# Patient Record
Sex: Male | Born: 1985 | Race: White | Hispanic: No | Marital: Single | Smoking: Never smoker
Health system: Southern US, Community
[De-identification: ages and names within clinical notes are randomized; demographics above are authoritative.]

---
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
## 2007-11-15 ENCOUNTER — Emergency Department (HOSPITAL_COMMUNITY): Admission: EM | Admit: 2007-11-15 | Discharge: 2007-11-15 | Payer: Self-pay | Admitting: Emergency Medicine

## 2007-11-17 ENCOUNTER — Inpatient Hospital Stay (HOSPITAL_COMMUNITY): Admission: EM | Admit: 2007-11-17 | Discharge: 2007-11-20 | Payer: Self-pay | Admitting: Emergency Medicine

## 2008-05-30 ENCOUNTER — Emergency Department (HOSPITAL_COMMUNITY): Admission: EM | Admit: 2008-05-30 | Discharge: 2008-05-30 | Payer: Self-pay | Admitting: Emergency Medicine

## 2009-10-29 IMAGING — US US ART/VEN ABD/PELV/SCROTUM DOPPLER COMPLETE
1 series · 14 of 25 positions shown · non-contrast
Comparison: None available

CLINICAL DATA: Left scrotal pain.

ULTRASOUND OF SCROTUM,ARTERIAL AND VENOUS ULTRASOUND OF THE ABDOMEN
PELVIS AND SCROTUM
TECHNIQUE: Complete ultrasound examination of the testicles,
epididymis, and other scrotal structures was performed.,
TECHNIQUE: Color and duplex Doppler ultrasound was utilized to
evaluate blood flow to the testicles and scrotal contents.

[Series 1: unknown · 0.09mm/px · 14 of 37 slices shown]
[im 1/37]
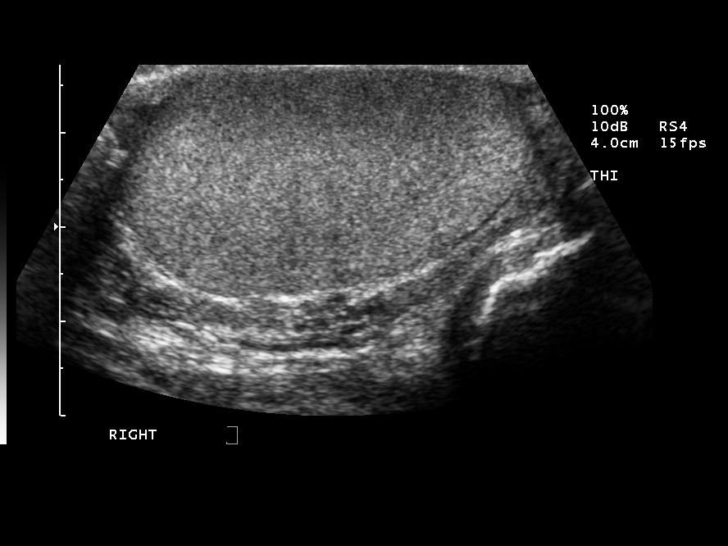
[im 4/37]
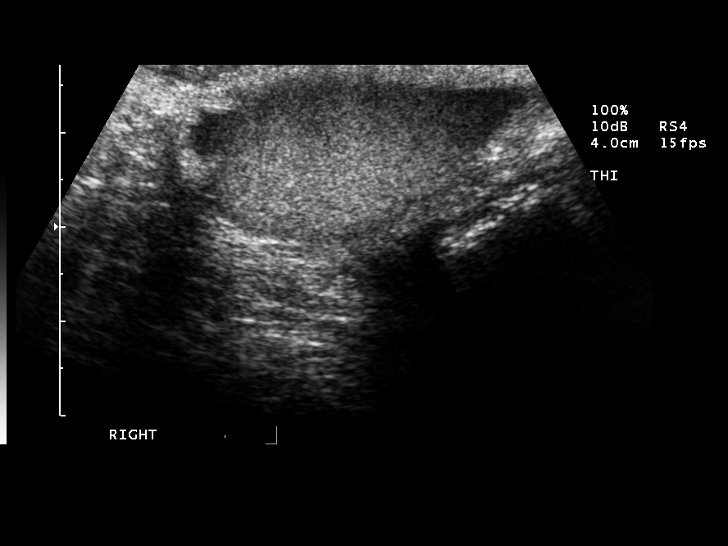
[im 7/37]
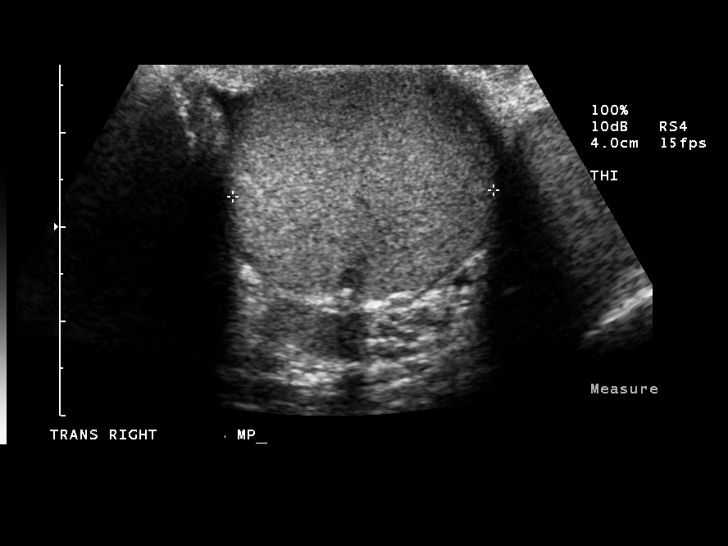
[im 10/37]
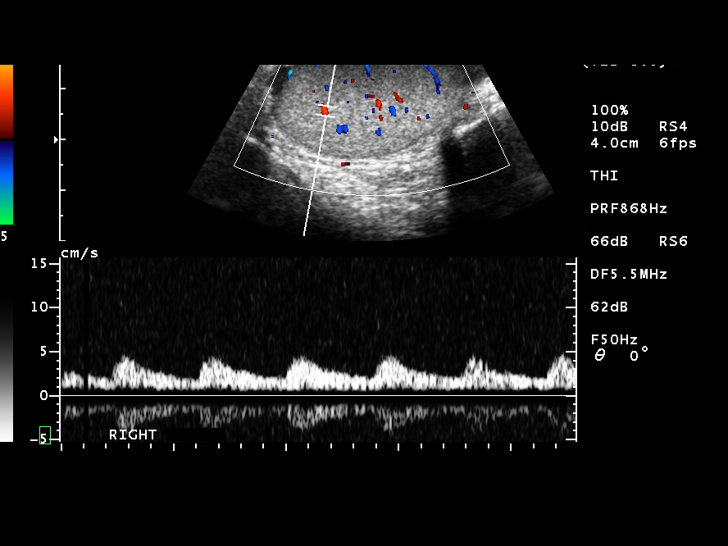
[im 13/37]
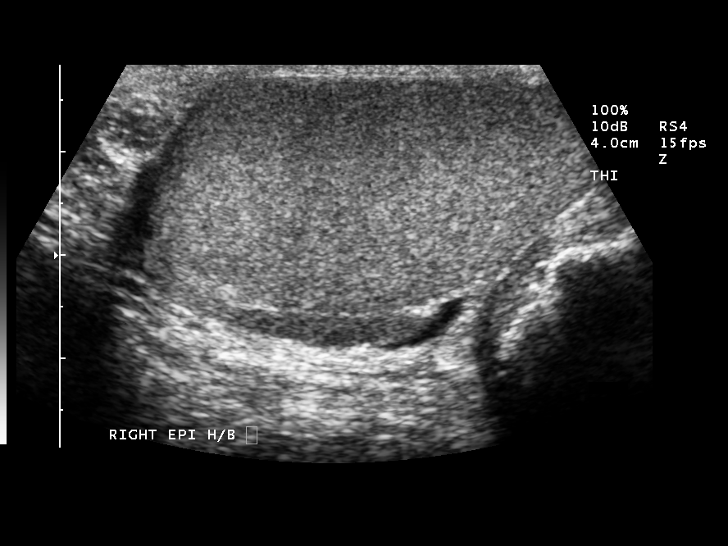
[im 14/37]
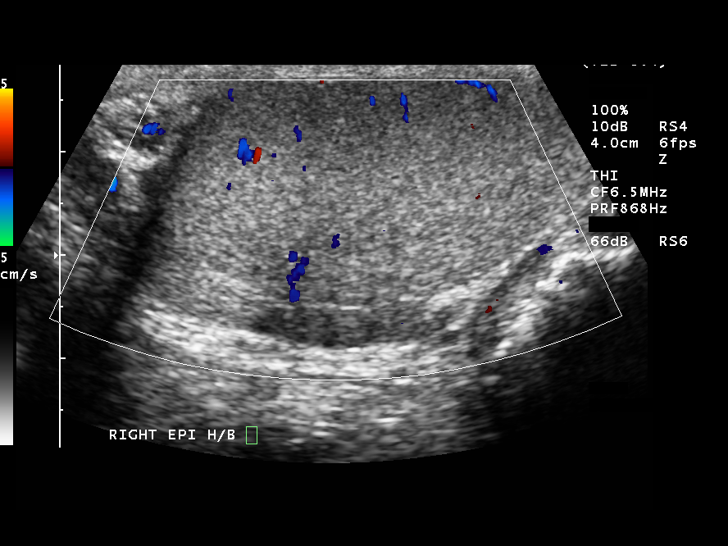
[im 17/37]
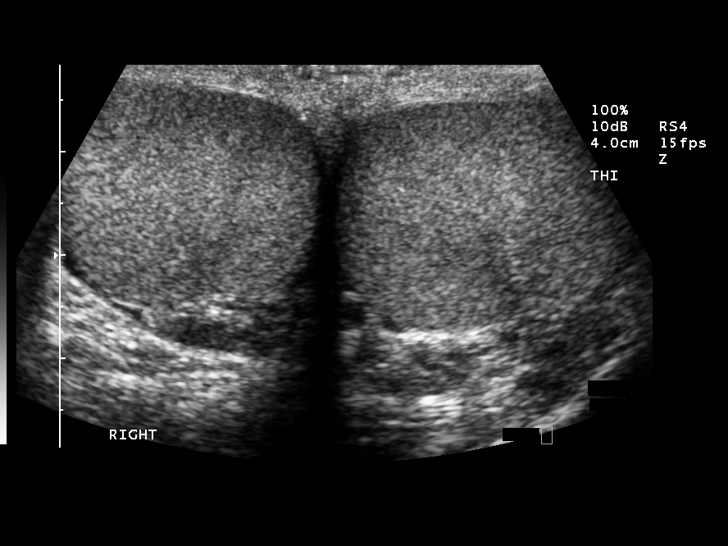
[im 20/37]
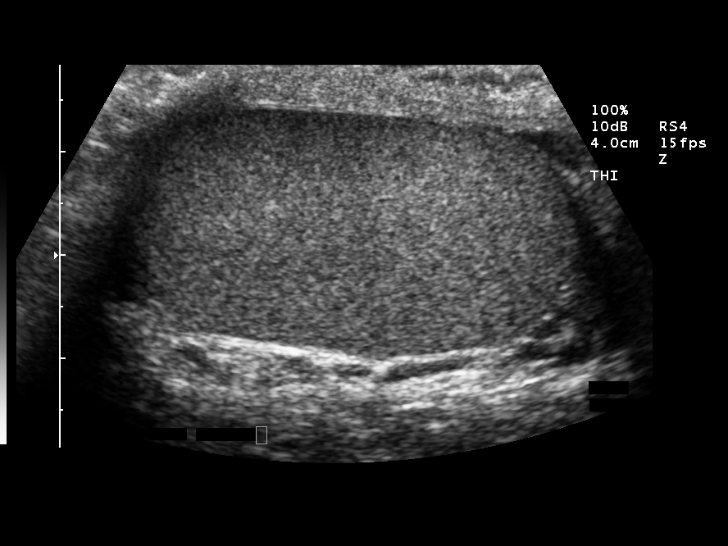
[im 23/37]
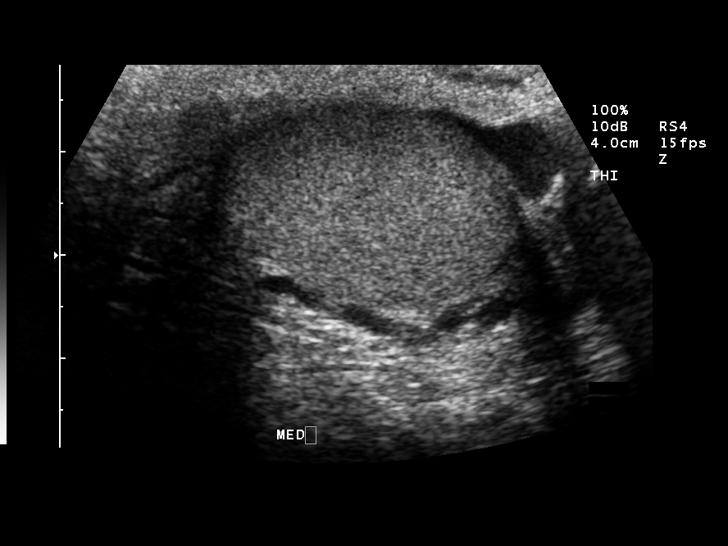
[im 25/37]
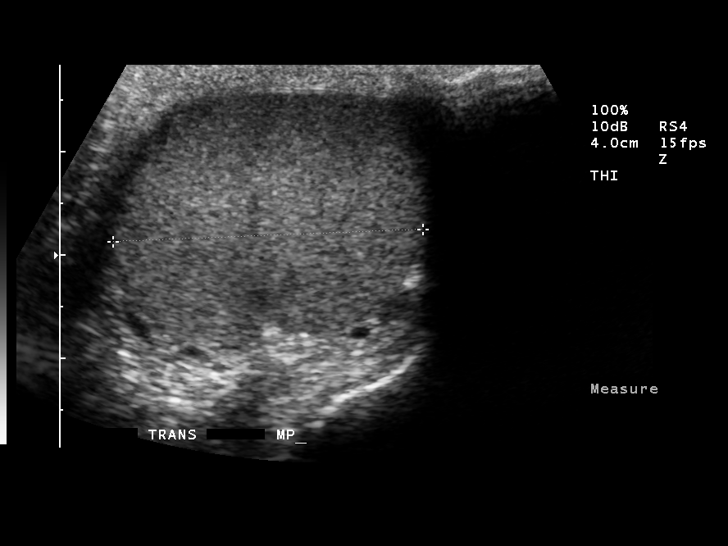
[im 28/37]
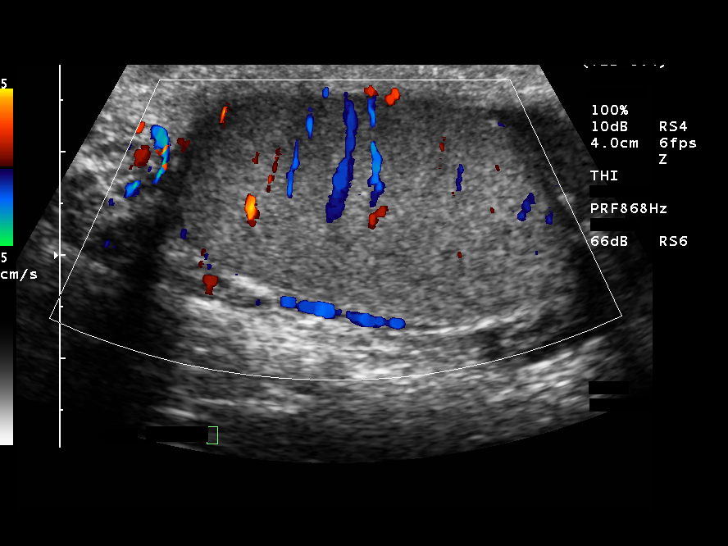
[im 31/37]
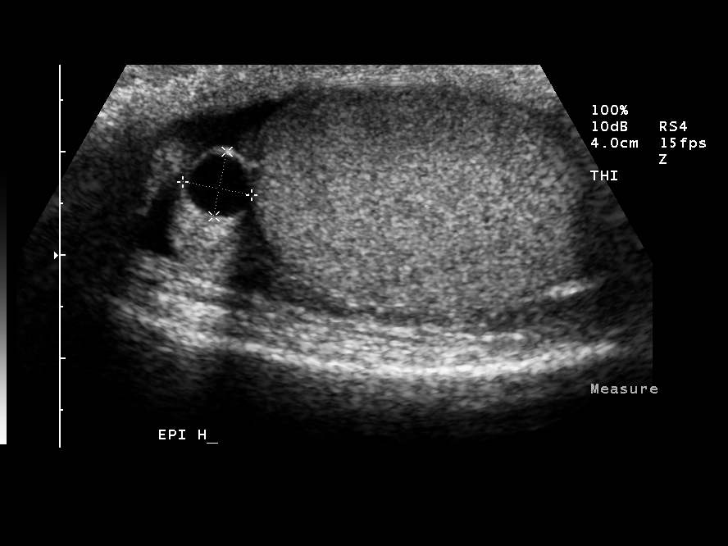
[im 34/37]
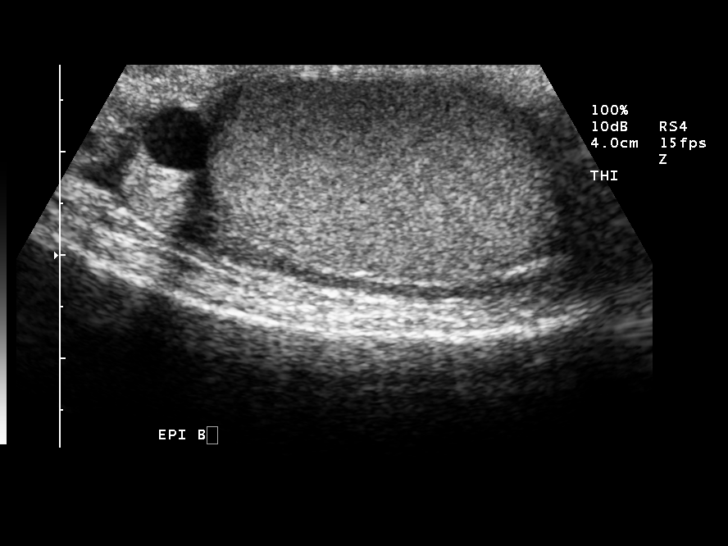
[im 37/37]
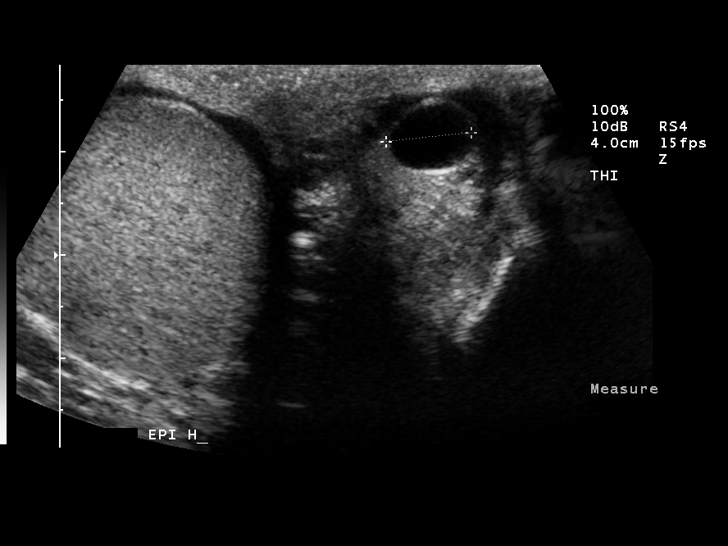

[14 of 25 positions shown; findings below may reference images not displayed]

FINDINGS: Right testicle measures 4.4 x 2.4 x 2.8 cm.  Normal color
Doppler flow.  Normal arterial and venous waveforms.

Left testicle measures 4.3 x 2.3 x 3.0 cm.  Normal color Doppler.
Normal arterial and venous waveforms.

Right epididymis measures 4.2 mm and is within normal limits.

Left epididymis measures 6 mm, with a 7 mm x 6 mm x 8 mm left
epididymal cyst.  No significant hydrocele.  No varicocele is
identified.
IMPRESSION: 1.  Normal testicular ultrasound

DOPPLER ULTRASOUND OF SCROTAL VESSELS
FINDINGS: Normal arterial and venous waveforms noted bilaterally.
IMPRESSION: As above.

## 2011-03-09 NOTE — Discharge Summary (Signed)
NAME:  Troy Whitney, Troy Whitney               ACCOUNT NO.:  [id number]   MEDICAL RECORD NO.:  [id number]          PATIENT TYPE:  INP   LOCATION:  6123                         FACILITY:  [facility]   PHYSICIAN:  Mobolaji B. Bakare, M.D.DATE OF BIRTH:  11-08-1985   DATE OF ADMISSION:  11/17/2007  DATE OF DISCHARGE:  11/20/2007                               DISCHARGE SUMMARY   PRIMARY CARE PHYSICIAN:  Unassigned.   FINAL DIAGNOSES:  1. Bilateral otitis externa.  2. Mild thrombocytopenia felt to be reactive.   CONSULT:  ENT consult provided by Dr. Jenne Pane.   PROCEDURES:  1. Head CT scan done on the 21st of January, 2009, was negative for      acute intracranial abnormality.  2. Maxillofacial CT without IV contrast showed mild bilateral      mastoiditis changes, fluid in right middle ear cavity, question      otitis media, fluid and/or soft tissue thickness throughout      external auditory canal bilaterally raising question of externa      otitis.  No definite fracture or bone destruction identified.   BRIEF HISTORY:  Please refer to the Admission H&P dictated by Dr. Elliot Cousin.  In brief, [name] is a 25 year old Caucasian male who has  been having bilateral earache for about a week prior to hospitalization.  He was treated on outpatient basis with amoxicillin; however, this did  not improve.  He had a temperature of 101 at home.  The patient decided  to come back to the hospital for further evaluation.  He had initial CT  scan of maxillofacial done in the emergency room which indicated  possibility of mastoiditis, otitis, bilateral otitis media and bilateral  otitis externa.  The patient was admitted for further evaluation and ENT  consult.  He was empirically started on Zosyn and he received oxycodone  and Dilaudid p.r.n. for pain.   Patient has been using Q-Tips quite frequently to clean his ears.  He  was seen by Dr. Jenne Pane.  Upon his evaluation, he indicated that this is  largely  bilateral otitis externa and there was no evidence of  mastoiditis.  He recommended Ciprodex eardrops twice a day.  Both ears  were packed with ear wicks at the bedside and Ciprodex Otic drops into  both ears.  Patient had slow progress, particularly from pain  standpoint.  He was kept further in the hospital for pain control.  Eventually pain subsided and patient was discharged home in a stable  condition.   DISCHARGE MEDICATIONS:  1. Ciprodex four drops in both ears b.i.d.  2. Dicloxacillin 250 mg q.i.d. for 1 week.  3. Oxycodone 5 mg every 4 hours p.r.n. for pain.   FOLLOWUP:  With Dr. Jenne Pane in 1 week.      Mobolaji B. Corky Downs, M.D.  Electronically Signed     MBB/MEDQ  D:  11/20/2007  T:  11/20/2007  Job:  [id number]   cc:   Antony Contras, MD

## 2011-03-09 NOTE — Consult Note (Signed)
Troy Whitney, Troy Whitney               ACCOUNT NO.:  [id number]   MEDICAL RECORD NO.:  [id number]          PATIENT TYPE:  INP   LOCATION:  6123                         FACILITY:  [facility]   PHYSICIAN:  Antony Contras, MD     DATE OF BIRTH:  11-09-1985   DATE OF CONSULTATION:  11/17/2007  DATE OF DISCHARGE:                                 CONSULTATION   REQUESTING SERVICE:  [facility].   CHIEF COMPLAINT:  Ear pain.   HISTORY OF PRESENT ILLNESS:  The patient is a 25 year old white male who  developed right-sided ear pain on Monday night and then worsened through  the evening and started to drain in the middle the night.  He went to  the emergency department on Tuesday night and was treated with  amoxicillin and discharged.  Yesterday, his left ear started to hurt and  started draining as well.  Last night, the drainage looked red.  His  neck and jaw were hurting as well.  He felt feverish and came back to  the emergency department.  He also complained of decreased hearing in  the right ear as well as chills, fever, and sweats.  He was admitted to  the hospital and put on Zosyn.  A temporal bone CT was ordered.  He  comments also that the right ear is bigger than the left when he looks  in a mirror.  He has no other complaints has not had any problem like  this before.   PAST MEDICAL HISTORY:  None.   HOME MEDICATIONS:  Amoxicillin, Hydrocodone   ALLERGIES:  NO KNOWN DRUG ALLERGIES.   FAMILY HISTORY:  Cancer, diabetes, seizures and CVA.   SOCIAL HISTORY:  The patient lives locally with his mother.  He works as  a Electrical engineer.  Denies smoking, alcohol, or drugs.   REVIEW OF SYSTEMS:  Negative except as listed above.   PHYSICAL EXAMINATION:  VITAL SIGNS:  Temperature 36.3 degrees Celsius,  vital signs stable.  GENERAL:  The patient is alert and in no acute distress.  He is pleasant  and cooperative.  HEENT:  Voice:  The voice is normal.  Right ear:  The external ear is  mildly  edematous and mildly tender.  The mastoid is nontender.  The ear  canal skin is markedly edematous obstructing the canal.  There is scant  yellow otorrhea.  The left ear the external ear is without edema but is  tender.  The canal skin is markedly edematous as well obstructing the  ear canal and there is trace yellow drainage.  Nose:  Clear external  nose is normal.  Nasal passages patent.  Septum is relatively midline.  Mouth:  Lips, teeth and gums are normal.  The tongue and floor of mouth  and buccal mucosa are normal.  The oropharynx is normal with a small  tonsils.  NECK:  The neck is nontender with no mass.  LYMPHATICS:  There are no enlarged lymph nodes in the neck.  THYROID GLAND:  Thyroid is normal on palpation.  SALIVARY GLANDS:  Salivary glands are normal to palpation.  NEUROLOGIC:  Cranial nerves II through XII grossly intact.   LABORATORY DATA:  White blood count is 8.   RADIOLOGY:  A CT scan of the face was personally reviewed that is dated  today.  The external ears are occluded with swollen tissue on both  sides.  The right middle ear space has some fluid and also some air.  The left middle ear space is aerated.  The mastoids are largely aerated  with a few scattered opacified cells.   ASSESSMENT:  The patient is a 25 year old white male with bilateral  otitis externa and no evidence of mastoiditis.   PLAN:  The ear canals are markedly edematous and probably will not allow  drops to go down them easily.  I will order ear wicks to the bedside to  be placed tomorrow in order to wick the drops down the ear canal.  I  will order Ciprodex drops to be started at 4 drops twice daily each ear.  It is likely that he will be able to be discharged tomorrow on Ciprodex  and an oral antibiotic, possibly something that covers Staphylococcus  aureus.      Antony Contras, MD  Electronically Signed     DDB/MEDQ  D:  11/17/2007  T:  11/18/2007  Job:  [id number]   cc:   Antony Contras, MD  Patient chart

## 2011-03-09 NOTE — H&P (Signed)
Troy Whitney, Troy Whitney               ACCOUNT NO.:  [id number]   MEDICAL RECORD NO.:  [id number]          PATIENT TYPE:  INP   LOCATION:  6123                         FACILITY:  [facility]   PHYSICIAN:  Elliot Cousin, M.D.    DATE OF BIRTH:  11-Sep-1986   DATE OF ADMISSION:  11/17/2007  DATE OF DISCHARGE:                              HISTORY & PHYSICAL   PRIMARY CARE PHYSICIAN:  Unassigned.   CHIEF COMPLAINT:  Pain and drainage of both ears.  Second visit to the  emergency department for the same.   HISTORY OF PRESENT ILLNESS:  The patient is a 25 year old man with no  significant past medical history who presents to the emergency  department with a chief complaint of bilateral ear pain.  Initially, the  patient only had right ear pain.  The pain started on the outside of the  ear and moved inward.  His symptoms started approximately three days  ago.  Soon afterwards, he experienced drainage from the right ear.  The  color of the drainage was yellow to green.  He presented to the  emergency department on November 15, 2007.  A CT scan of his head was  performed and it revealed no acute abnormalities.  He was given a  prescription for amoxicillin and was sent home.  The patient did get the  amoxicillin prescription filled and took the antibiotic for two days.  However, last night, the patient developed left ear pain and drainage.  In addition to the  bilateral ear pain, he has bilateral jaw pain and  some associated neck pain.  He denies throat or posterior pharyngeal  swelling or redness.  He denies photophobia.  He has had subjective  fever and chills.  When questioned about Q-tip usage, the patient says  that he uses Q-tips to dry his ears after showering almost daily.  His  mother, who was present, states that he constantly uses Q-tips to clean  his ears.  The patient has not been exposed to any known sick contact.  He has no history of ear infections as a child.  The patient denies  dizziness, headache or confusion.  His mother has seen no change in his  mental status.   During the evaluation in the emergency department, a CT scan of the face  (maxillofacial CT) was ordered.  The results are significant for mild  bilateral mastoiditis, fluid in the right middle ear cavity, question  otitis media, fluid and/or soft tissue thickening throughout the  external auditory canals bilaterally, question external otitis.  The  patient's temperature is 101.0.  His white blood cell count is within  normal limits at 8.0.  However, given failed outpatient therapy, the  patient will be admitted for further evaluation and management.   PAST MEDICAL HISTORY:  Childhood heart murmur.   MEDICATIONS:  Amoxicillin as previously prescribed by the emergency  department physician.   ALLERGIES:  No known drug allergies.   SOCIAL HISTORY:  The patient is single.  He lives in [place] with his  mother and sister.  He has no children.  He is  employed as a Engineer, site.  He denies tobacco, alcohol and drug use.   FAMILY HISTORY:  Mother is 27 years of age and has a history of seizure  disorder, cerebral hemorrhage and DVT.  Father died at 55 years of age  from injuries suffered in a motor vehicle accident.  He had a history of  pancreatitis and hypertension.   REVIEW OF SYSTEMS:  Positive for bilateral ear pain, jaw pain and neck  pain.  Positive for a decrease in hearing.  Otherwise, review of systems  is negative.   PHYSICAL EXAMINATION:  VITAL SIGNS:  Temperature 101.0, blood pressure  128/80, pulse 102, respirations 20, oxygen saturation 98% on room air.  GENERAL:  The patient is a pleasant, alert, 25 year old Caucasian male  who is currently sitting up in bed in no acute distress, although, he  does appear ill.  HEENT:  Head is normocephalic, nontraumatic.  Pupils equal, round and  reactive to light.  Extraocular movements intact.  Conjunctivae are  clear.  Sclerae are white.   Nasal mucosa is moist.  No sinus tenderness.  The right greater than the left tympanic membranes are erythematous and  edematous.  There is a yellowish exudate in the external canals  bilaterally, greater on the right.  There is surrounding erythema and  tenderness over the external ear with a moderate amount of tenderness,  right greater than left.  There is mild to moderate tenderness over the  mastoid area bilaterally.  Oropharynx reveals mildly dry mucous  membranes.  No posterior exudate, edema, or erythema.  NECK:  Supple, although, tenderness over the submandibular and cervical  regions.  Palpable shotty adenopathy over the cervical and postauricular  regions.  No thyromegaly.  No bruit.  No JVD.  LUNGS:  Clear to auscultation bilaterally.  HEART:  S1, S2 with no murmurs, rubs or gallops.  ABDOMEN:  Obese positive bowel sounds, Soft, nontender, nondistended.  No hepatosplenomegaly.  No masses palpated.  GU/RECTAL:  Deferred.  EXTREMITIES:  Pedal pulses 2+ bilaterally.  No pedal edema.  No  pretibial edema.  NEUROLOGICAL:  The patient is alert and oriented x3.  Cranial nerves II-  XII intact.  Strength is 5/5 throughout.  Sensation is intact.   ADMISSION LABORATORIES:  CT scan of the maxillofacial results are above.  WBC 8.0, hemoglobin 14.0, platelets 163.   ASSESSMENT:  Bilateral otitis media, probable otitis externa and mild  bilateral mastoiditis.  The patient does not appear toxic, although, he  is febrile.  Surprisingly, he does not have a leukocytosis.  The patient  failed outpatient therapy with amoxicillin.  Of note, he readily admits  to using Q-tips in his ears, almost on a daily basis.  This is perhaps  the etiology of the infections as the patient had no childhood ear  infections according to his mother.   PLAN:  1. The patient will be admitted for further evaluation and management.  2. Blood cultures have been ordered by the emergency department       physician.  3. Will start broad spectrum antibiotic treatment with intravenous      Zosyn.  4. Pain management with as needed Dilaudid or oxycodone.  Will add      scheduled ibuprofen 600 mg q.8 h.  5. Consider ENT consultation.  6. The patient was strongly advised not to use any more Q-tips in his      ears.  7. Will check a comprehensive metabolic panel.  Elliot Cousin, M.D.  Electronically Signed     DF/MEDQ  D:  11/17/2007  T:  11/17/2007  Job:  [id number]

## 2011-07-16 LAB — COMPREHENSIVE METABOLIC PANEL
ALT: 16
Albumin: 3.6
Alkaline Phosphatase: 46
BUN: 9
Calcium: 8.7
Potassium: 3.5
Sodium: 136
Total Protein: 6.4

## 2011-07-16 LAB — CBC
HCT: 35.5 — ABNORMAL LOW
HCT: 36.3 — ABNORMAL LOW
Hemoglobin: 14
MCHC: 34.2
MCHC: 34.2
MCV: 83.8
MCV: 84.2
Platelets: 133 — ABNORMAL LOW
Platelets: 153
RBC: 4.32
RDW: 13.4
RDW: 14.4
WBC: 6.4
WBC: 6.6

## 2011-07-16 LAB — POCT I-STAT CREATININE: Operator id: 161631

## 2011-07-16 LAB — DIFFERENTIAL
Basophils Absolute: 0
Lymphocytes Relative: 11 — ABNORMAL LOW
Monocytes Absolute: 0.8
Neutro Abs: 6.3
Neutrophils Relative %: 79 — ABNORMAL HIGH

## 2011-07-16 LAB — BASIC METABOLIC PANEL
CO2: 32
Calcium: 8.8
Chloride: 101
Glucose, Bld: 97
Sodium: 137

## 2011-07-16 LAB — CULTURE, BLOOD (ROUTINE X 2): Culture: NO GROWTH

## 2011-07-16 LAB — I-STAT 8, (EC8 V) (CONVERTED LAB)
Acid-Base Excess: 4 — ABNORMAL HIGH
Chloride: 100
HCT: 44
Operator id: 161631
Potassium: 3.6
Sodium: 135

## 2011-07-23 LAB — URINALYSIS, ROUTINE W REFLEX MICROSCOPIC
Bilirubin Urine: NEGATIVE
Ketones, ur: NEGATIVE
Nitrite: NEGATIVE
Protein, ur: NEGATIVE
Urobilinogen, UA: 0.2
pH: 6.5

## 2013-07-04 ENCOUNTER — Encounter (HOSPITAL_COMMUNITY): Payer: Self-pay

## 2013-07-04 ENCOUNTER — Emergency Department (HOSPITAL_COMMUNITY)
Admission: EM | Admit: 2013-07-04 | Discharge: 2013-07-04 | Disposition: A | Discharge status: Home / Self Care | Payer: No Typology Code available for payment source | Attending: Emergency Medicine | Admitting: Emergency Medicine

## 2013-07-04 DIAGNOSIS — N4889 Other specified disorders of penis: Secondary | ICD-10-CM | POA: Insufficient documentation

## 2013-07-04 DIAGNOSIS — B356 Tinea cruris: Secondary | ICD-10-CM | POA: Insufficient documentation

## 2013-07-04 LAB — URINALYSIS, ROUTINE W REFLEX MICROSCOPIC
Bilirubin Urine: NEGATIVE
Hgb urine dipstick: NEGATIVE
Ketones, ur: NEGATIVE mg/dL
Nitrite: NEGATIVE
Protein, ur: NEGATIVE mg/dL
Specific Gravity, Urine: 1.019 (ref 1.005–1.030)
Urobilinogen, UA: 0.2 mg/dL (ref 0.0–1.0)

## 2013-07-04 MED ORDER — BACITRACIN ZINC 500 UNIT/GM EX OINT: TOPICAL_OINTMENT | Freq: Two times a day (BID) | CUTANEOUS | Status: DC

## 2013-07-04 MED ORDER — CLOTRIMAZOLE 1 % EX CREA: TOPICAL_CREAM | CUTANEOUS | Status: AC

## 2013-07-04 NOTE — ED Provider Notes (Signed)
CSN: [id number]     Arrival date & time 07/04/13  2129 History   First MD Initiated Contact with Patient 07/04/13 2237     Chief Complaint  Patient presents with  . Groin Pain   HPI  History provided by the patient. Patient is a 27 year old male with no significant PMH who presents with concerns for persistent redness and irritation to the base of his penis and scrotum. Symptoms have been present for the past 2 weeks and have been very gradually worsening in size. He reports an area of itching and burning with some redness. He also states there is slight bruising to the area. Patient is heterosexual and sexually active with one partner only. He does use condoms but has not changed brands. Does not use any new lubricants. He denies any new body lotions, body soaps or shampoos. No new clothing or laundry detergents. Denies any rash elsewhere on the body. Denies having similar symptoms previously. 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 No other aggravating or alleviating factors. No associated penile discharge, penile or scrotal swelling. No testicular pain. No tender lymph nodes. No fever, chills or sweats.    History reviewed. No pertinent past medical history. History reviewed. No pertinent past surgical history. History reviewed. No pertinent family history. History  Substance Use Topics  . Smoking status: Never Smoker   . Smokeless tobacco: Not on file  . Alcohol Use: Yes    Review of Systems  Constitutional: Negative for fever, chills and diaphoresis.  Gastrointestinal: Negative for abdominal pain.  Genitourinary: Negative for dysuria, frequency, hematuria, flank pain, discharge, penile swelling, scrotal swelling, penile pain and testicular pain.  All other systems reviewed and are negative.    Allergies  Review of patient's allergies indicates no known allergies.  Home Medications   Current Outpatient Rx  Name  Route  Sig  Dispense   Refill  . bacitracin ointment   Topical   Apply topically 2 (two) times daily.   120 g   0   . clotrimazole (LOTRIMIN) 1 % cream      Apply to affected area 2 times daily   15 g   0    BP 141/96  Pulse 77  Temp(Src) 98.1 F (36.7 C) (Oral)  Resp 18  Ht 6\' 3"  (1.905 m)  Wt 195 lb (88.451 kg)  BMI 24.37 kg/m2  SpO2 99% Physical Exam  Nursing note and vitals reviewed. Constitutional: He appears well-developed and well-nourished.  HENT:  Head: Normocephalic.  Cardiovascular: Normal rate and regular rhythm.   Pulmonary/Chest: Effort normal and breath sounds normal.  Abdominal: Soft.  Genitourinary: Testes normal and penis normal. Right testis shows no mass, no swelling and no tenderness. Left testis shows no mass, no swelling and no tenderness. Circumcised. No penile tenderness. No discharge found.  Broad area of erythema at the base of the penis and portion of the scrotum. There is small amount of weeping fluid. No purulent. No induration of the skin. No significant tenderness. No nodularity or concerns for abscess. No signs concerning for Fournier's gangrene.  Lymphadenopathy:       Right: No inguinal adenopathy present.       Left: No inguinal adenopathy present.  Skin: Skin is warm.  See genital exam    ED Course  Procedures   Results for orders placed during the hospital encounter of 07/04/13  URINALYSIS, ROUTINE W REFLEX MICROSCOPIC      Result Value Range  Color, Urine YELLOW  YELLOW   APPearance CLEAR  CLEAR   Specific Gravity, Urine 1.019  1.005 - 1.030   pH 6.0  5.0 - 8.0   Glucose, UA NEGATIVE  NEGATIVE mg/dL   Hgb urine dipstick NEGATIVE  NEGATIVE   Bilirubin Urine NEGATIVE  NEGATIVE   Ketones, ur NEGATIVE  NEGATIVE mg/dL   Protein, ur NEGATIVE  NEGATIVE mg/dL   Urobilinogen, UA 0.2  0.0 - 1.0 mg/dL   Nitrite NEGATIVE  NEGATIVE   Leukocytes, UA NEGATIVE  NEGATIVE       MDM   1. Tinea cruris     10:40 PM patient seen and evaluated. Patient  appears well no acute distress. Patient has had a rash for the last 2 weeks with slight irritation primarily a itching and burning sensation. 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 He is extracted with one partner only for long period time.    Angus Seller, PA-C 07/04/13 2256

## 2013-07-04 NOTE — ED Notes (Signed)
Groin rash and itching since last weeki

## 2013-07-04 NOTE — ED Notes (Signed)
Pt c/o redness, itching and testicular pain x2 weeks

## 2013-07-05 NOTE — ED Provider Notes (Signed)
Medical screening examination/treatment/procedure(s) were performed by non-physician practitioner and as supervising physician I was immediately available for consultation/collaboration.   Denisse Whitenack L Deedra Pro, MD 07/05/13 2210 

## 2013-08-12 ENCOUNTER — Encounter (HOSPITAL_COMMUNITY): Payer: Self-pay | Admitting: Emergency Medicine

## 2013-08-12 ENCOUNTER — Emergency Department (HOSPITAL_COMMUNITY): Payer: No Typology Code available for payment source

## 2013-08-12 ENCOUNTER — Emergency Department (HOSPITAL_COMMUNITY)
Admission: EM | Admit: 2013-08-12 | Discharge: 2013-08-12 | Disposition: A | Discharge status: Home / Self Care | Payer: No Typology Code available for payment source | Attending: Emergency Medicine | Admitting: Emergency Medicine

## 2013-08-12 DIAGNOSIS — Y9367 Activity, basketball: Secondary | ICD-10-CM | POA: Insufficient documentation

## 2013-08-12 DIAGNOSIS — S93401A Sprain of unspecified ligament of right ankle, initial encounter: Secondary | ICD-10-CM

## 2013-08-12 DIAGNOSIS — Y9239 Other specified sports and athletic area as the place of occurrence of the external cause: Secondary | ICD-10-CM | POA: Insufficient documentation

## 2013-08-12 DIAGNOSIS — S93409A Sprain of unspecified ligament of unspecified ankle, initial encounter: Secondary | ICD-10-CM | POA: Insufficient documentation

## 2013-08-12 DIAGNOSIS — X500XXA Overexertion from strenuous movement or load, initial encounter: Secondary | ICD-10-CM | POA: Insufficient documentation

## 2013-08-12 MED ORDER — HYDROCODONE-ACETAMINOPHEN 5-325 MG PO TABS: 1.0000 | ORAL_TABLET | Freq: Four times a day (QID) | ORAL | Status: AC | PRN

## 2013-08-12 MED ORDER — NAPROXEN 500 MG PO TABS: 500.0000 mg | ORAL_TABLET | Freq: Two times a day (BID) | ORAL | Status: AC

## 2013-08-12 NOTE — ED Notes (Signed)
Patient transported to X-ray 

## 2013-08-12 NOTE — ED Provider Notes (Signed)
Medical screening examination/treatment/procedure(s) were performed by non-physician practitioner and as supervising physician I was immediately available for consultation/collaboration.   Niquita Digioia, MD 08/12/13 2308 

## 2013-08-12 NOTE — ED Notes (Signed)
Reports injuring right ankle yesterday while playing bball. Has swelling, pain and bruising to right ankle.

## 2013-08-12 NOTE — ED Notes (Signed)
Patient returned from X-ray 

## 2013-08-12 NOTE — ED Provider Notes (Signed)
CSN: [id number]     Arrival date & time 08/12/13  1901 History  This chart was scribed for non-physician practitioner Magnus Sinning, PA-C, working with Rolan Bucco, MD by Dorothey Baseman, ED Scribe. This patient was seen in room TR09C/TR09C and the patient's care was started at 8:37 PM.    Chief Complaint  Patient presents with  . Ankle Pain   The history is provided by the patient. No language interpreter was used.   HPI Comments: Troy Whitney is a 27 y.o. male who presents to the Emergency Department complaining of a constant pain to the lateral right ankle with associated swelling and ecchymosis onset yesterday while playing basketball when he states that he landed on it incorrectly and the ankle rolled forward. Patient reports that the pain is exacerbated with walking, but that he has been ambulatory since the incident. Patient reports taking ibuprofen and applying ice to the area with temporary relief. He denies any numbness or paresthesias. Patient denies any pertinent medical history.   History reviewed. No pertinent past medical history. History reviewed. No pertinent past surgical history. History reviewed. No pertinent family history. History  Substance Use Topics  . Smoking status: Never Smoker   . Smokeless tobacco: Not on file  . Alcohol Use: Yes    Review of Systems  A complete 10 system review of systems was obtained and all systems are negative except as noted in the HPI and PMH.   Allergies  Review of patient's allergies indicates no known allergies.  Home Medications   Current Outpatient Rx  Name  Route  Sig  Dispense  Refill  . clotrimazole (LOTRIMIN) 1 % cream      Apply to affected area 2 times daily   15 g   0    Triage Vitals: BP 114/73  Pulse 98  Temp(Src) 97.7 F (36.5 C) (Oral)  Resp 20  SpO2 99%  Physical Exam  Nursing note and vitals reviewed. Constitutional: He is oriented to person, place, and time. He appears well-developed and  well-nourished. No distress.  HENT:  Head: Normocephalic and atraumatic.  Eyes: Conjunctivae are normal.  Neck: Normal range of motion. Neck supple.  Cardiovascular: Normal rate, regular rhythm and normal heart sounds.   Pulmonary/Chest: Effort normal and breath sounds normal. No respiratory distress.  Abdominal: He exhibits no distension.  Musculoskeletal: Normal range of motion. He exhibits edema and tenderness.  Tenderness to palpation along lateral right malleolus. 2+ DP pulse.   Neurological: He is alert and oriented to person, place, and time.  Distal sensation of all toes of the right foot is intact.   Skin: Skin is warm and dry.  Ecchymosis and swelling along the lateral aspect of the right malleolus.   Psychiatric: He has a normal mood and affect. His behavior is normal.    ED Course  Procedures (including critical care time)  DIAGNOSTIC STUDIES: Oxygen Saturation is 99% on room air, normal by my interpretation.    COORDINATION OF CARE: 8:40 PM- Discussed that x-ray results do not indicate any fractures and that symptoms are due to a sprain. Will discharge patient with an ankle brace and crutches and pain medication. Advised patient to follow RICE procedures and to take anti-inflammatory medications until symptoms subside. Discussed treatment plan with patient at bedside and patient verbalized agreement.     Labs Review Labs Reviewed - No data to display  Imaging Review Dg Ankle Complete Right  08/12/2013   CLINICAL DATA:  Twisting injury playing basketball  today.  EXAM: RIGHT ANKLE - COMPLETE 3+ VIEW  COMPARISON:  None.  FINDINGS: The mineralization and alignment are normal. There is no evidence of acute fracture or dislocation. There is moderate lateral soft tissue swelling.  IMPRESSION: No acute osseous findings. Lateral soft tissue swelling.   Electronically Signed   By: Roxy Horseman M.D.   On: 08/12/2013 20:18    EKG Interpretation   None       MDM  No  diagnosis found. Patient presenting with ankle injury.  Xray negative for fracture.  Patient is neurovascularly intact.  Patient given ankle ASO and crutches.  Patient stable for discharge.  Return precautions given.  I personally performed the services described in this documentation, which was scribed in my presence. The recorded information has been reviewed and is accurate.     Pascal Lux [place], PA-C 08/12/13 2123

## 2013-08-12 NOTE — Progress Notes (Signed)
Orthopedic Tech Progress Note Patient Details:  Troy Whitney 1986/04/09 [id number]  Ortho Devices Type of Ortho Device: ASO;Crutches Ortho Device/Splint Location: RLE Ortho Device/Splint Interventions: Ordered;Application   [name] 08/12/2013, 9:05 PM

## 2013-08-13 NOTE — ED Notes (Signed)
Patient called requesting work note.Troy Whitney gave approval to PFM to write note to return to work on 08/14/2013. Marland Kitchen

## 2015-01-11 IMAGING — CR DG ANKLE COMPLETE 3+V*R*
3 series · 3 of 3 positions shown · non-contrast
Comparison: None.

CLINICAL DATA: Twisting injury playing basketball today.

EXAM:
RIGHT ANKLE - COMPLETE 3+ VIEW

[x ankle ap right]
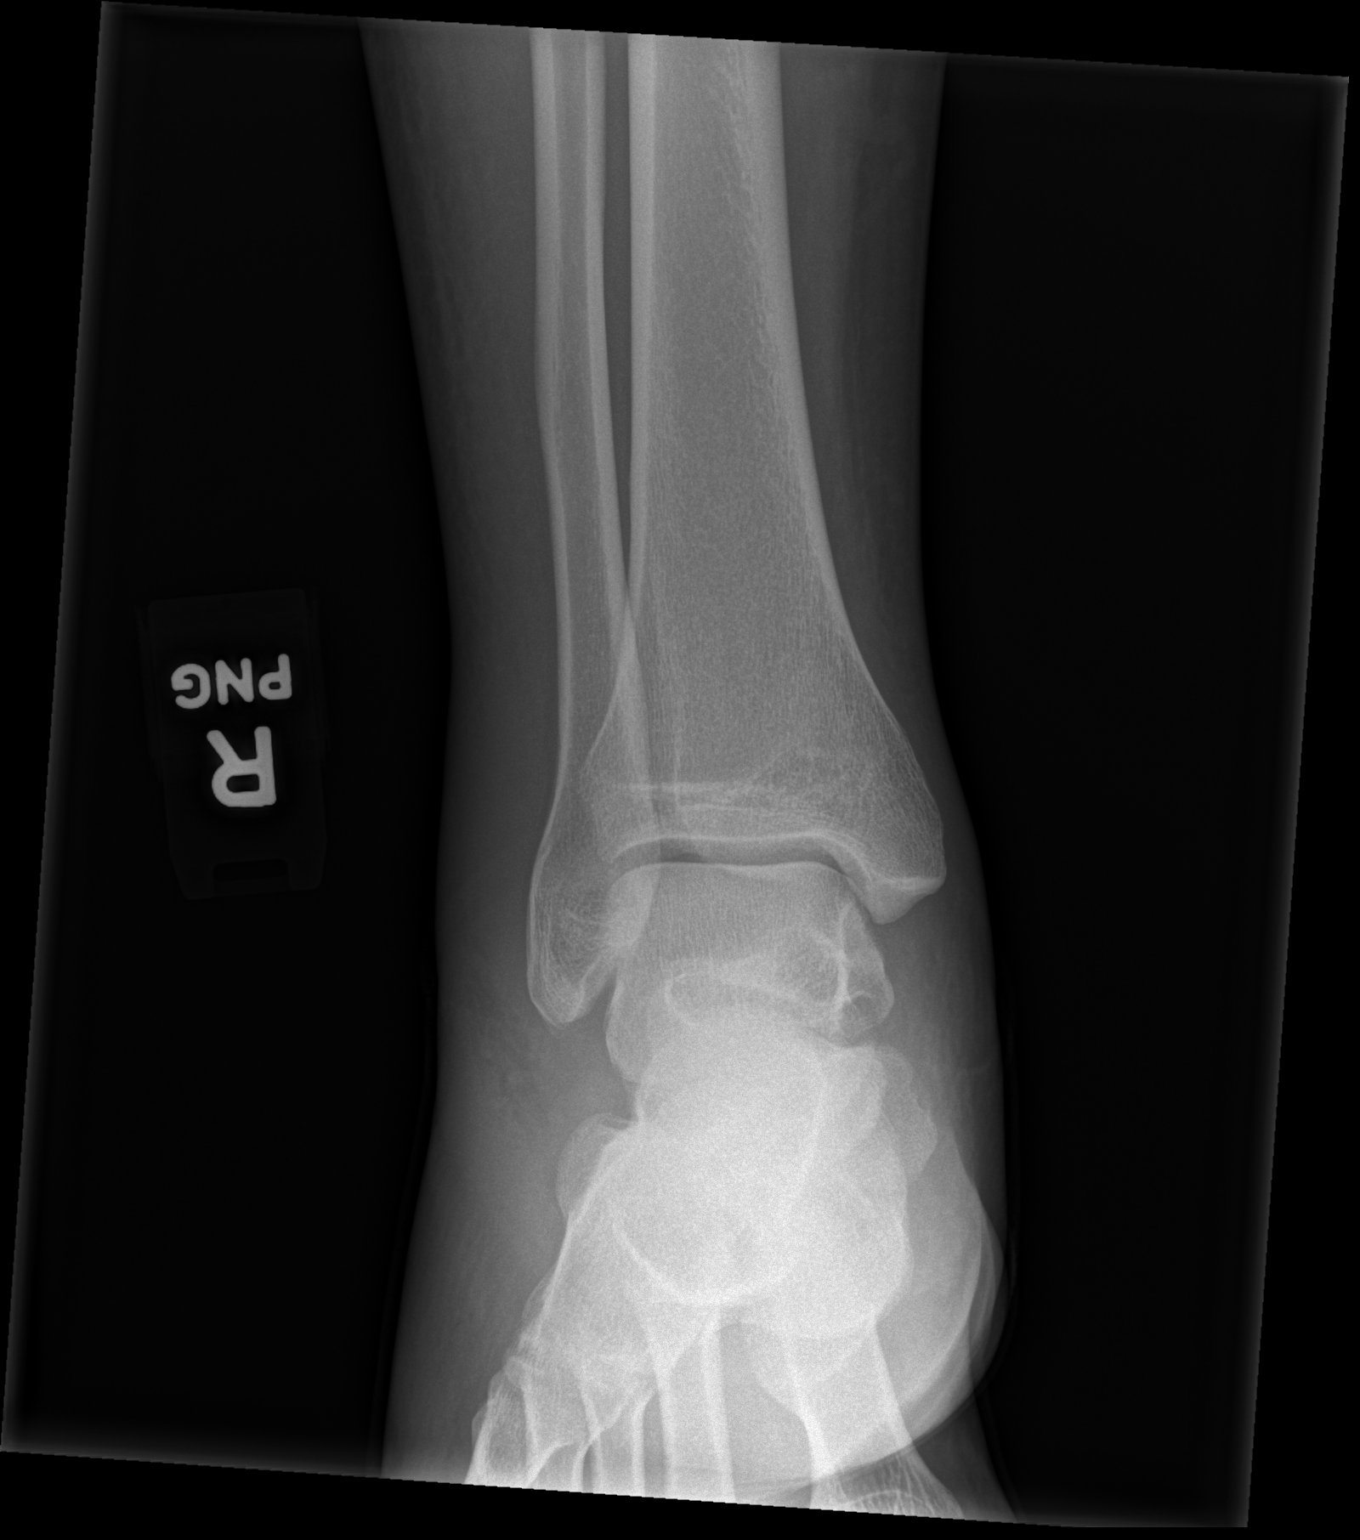

[x ankle obl right]
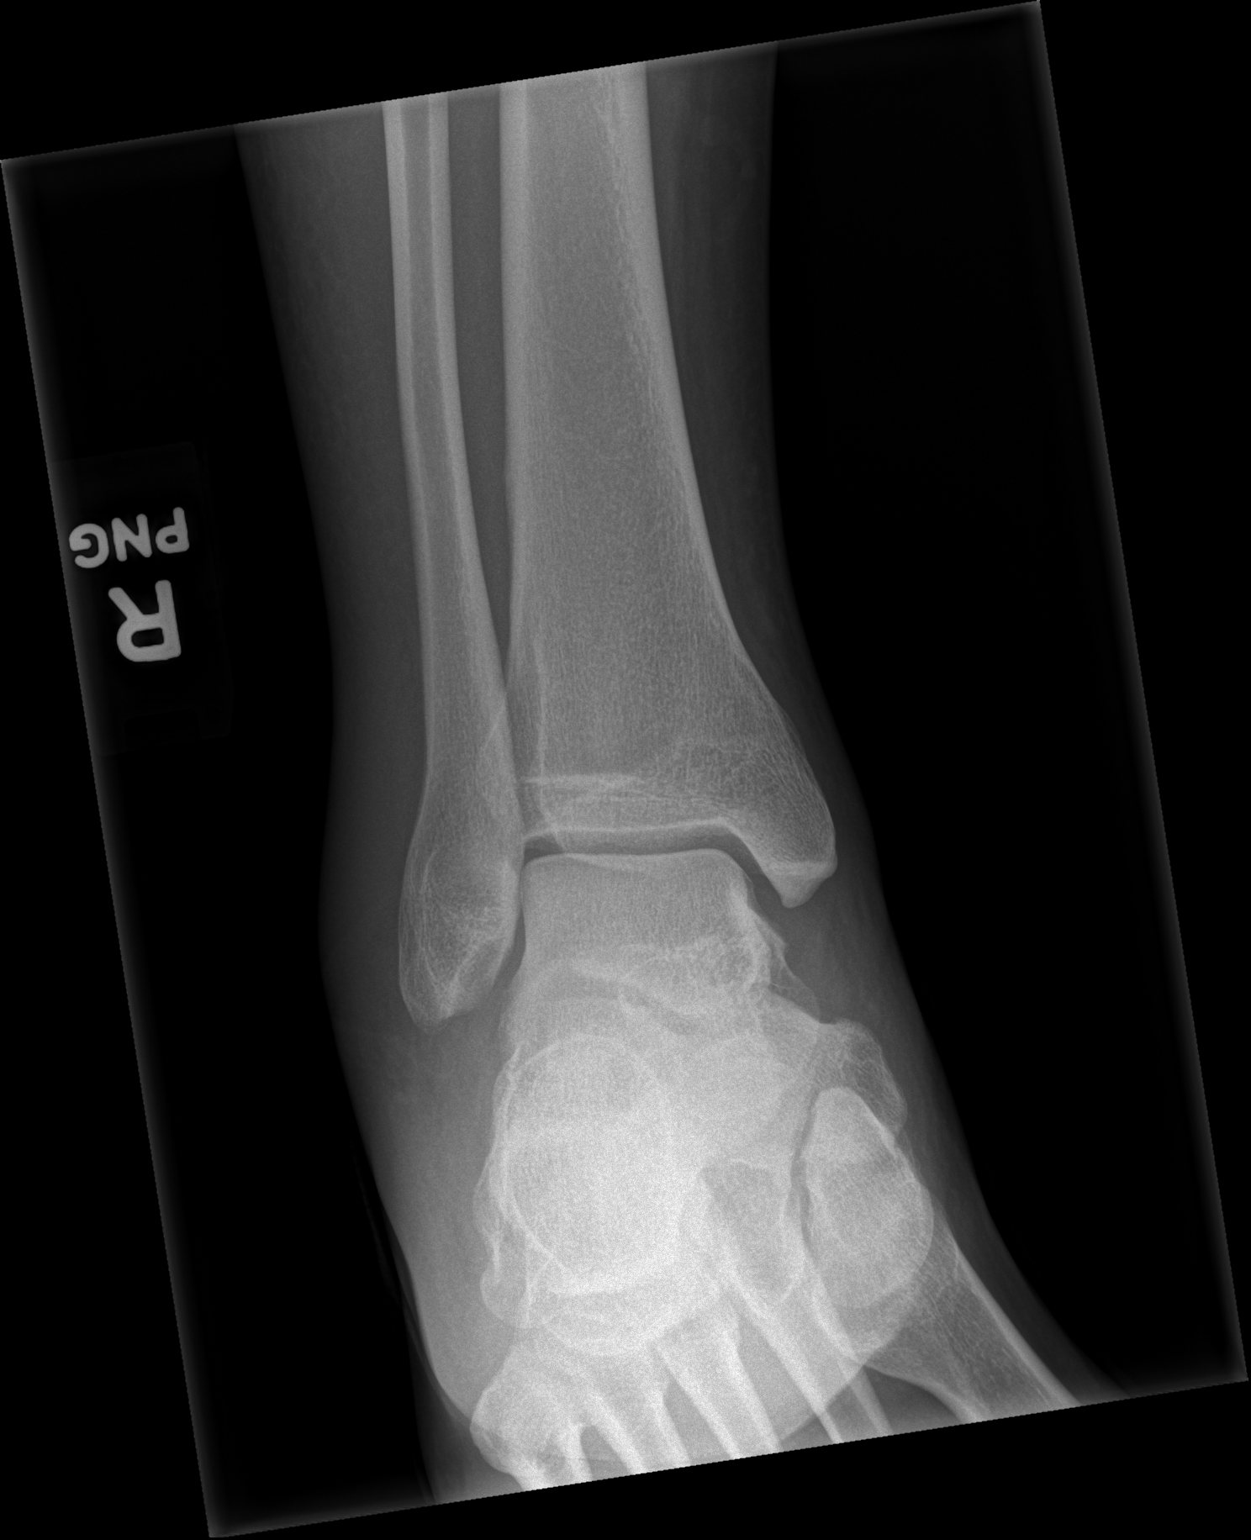

[x ankle lat right]
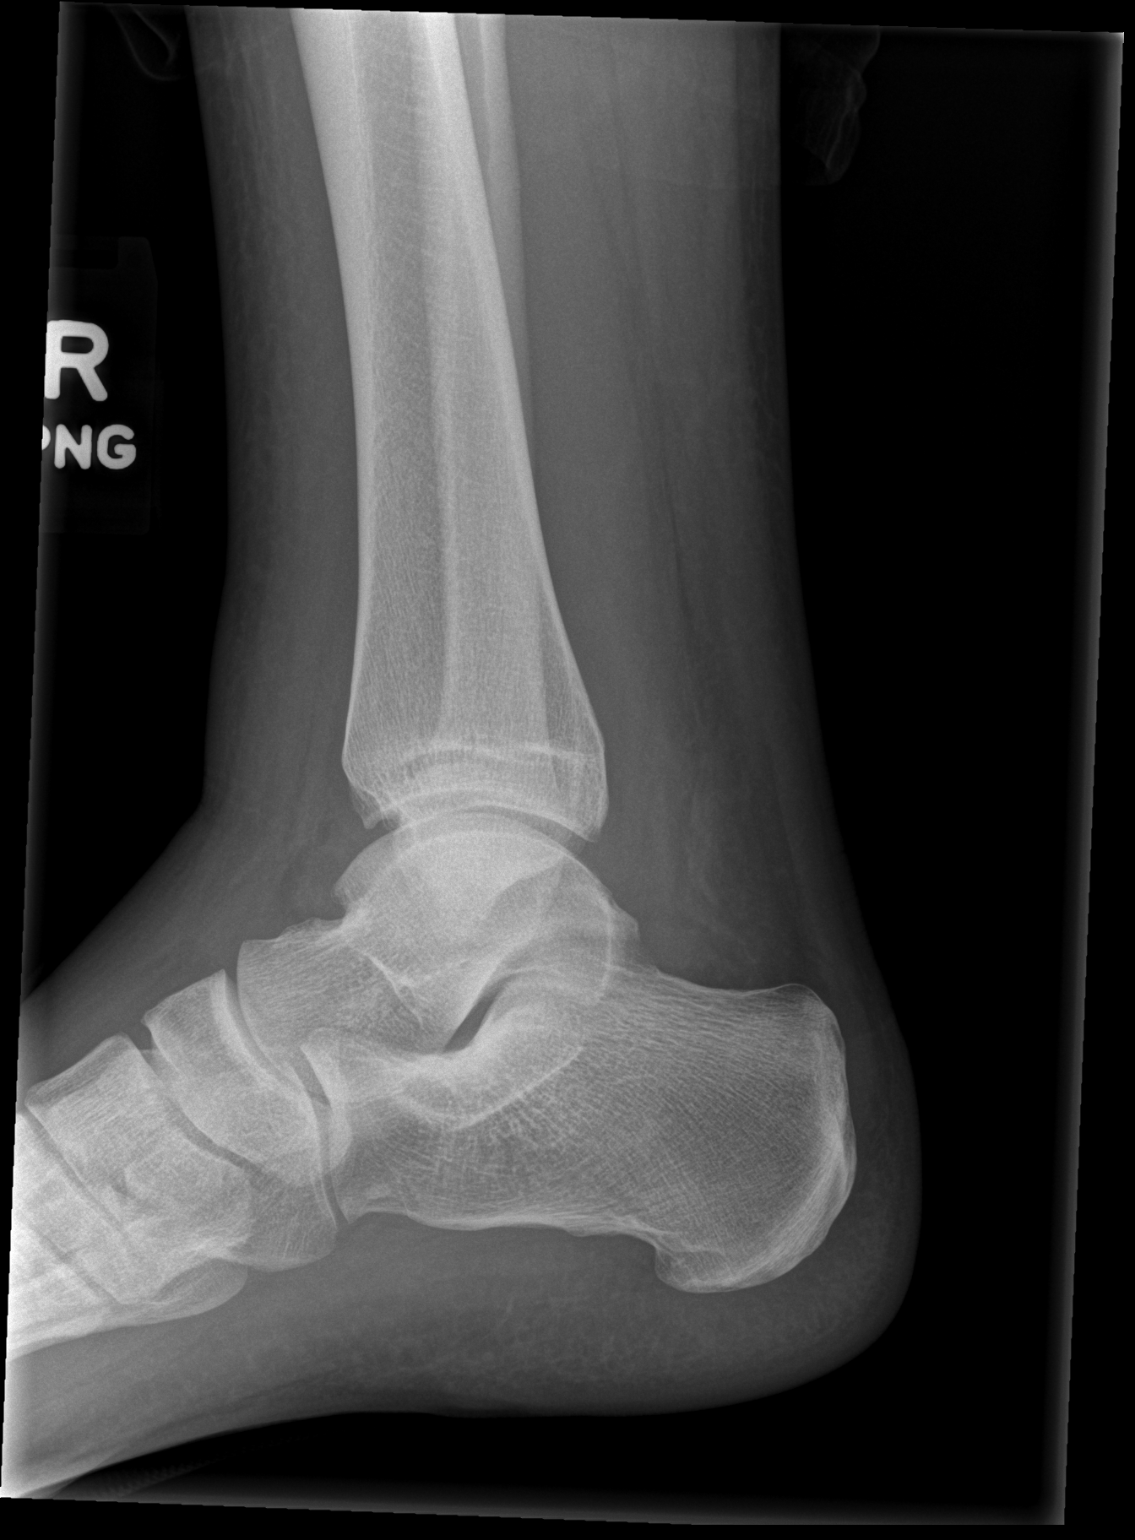

[3 of 3 positions shown; findings below may reference images not displayed]

FINDINGS: The mineralization and alignment are normal. There is no evidence of
acute fracture or dislocation. There is moderate lateral soft tissue
swelling.
IMPRESSION: No acute osseous findings. Lateral soft tissue swelling.

## 2023-01-24 ENCOUNTER — Other Ambulatory Visit: Payer: Self-pay | Admitting: Rehabilitation

## 2023-01-24 DIAGNOSIS — M47816 Spondylosis without myelopathy or radiculopathy, lumbar region: Secondary | ICD-10-CM

## 2023-02-07 ENCOUNTER — Encounter: Payer: Self-pay | Admitting: Rehabilitation

## 2023-02-14 ENCOUNTER — Other Ambulatory Visit: Payer: No Typology Code available for payment source

## 2023-06-18 ENCOUNTER — Ambulatory Visit
Admission: RE | Admit: 2023-06-18 | Discharge: 2023-06-18 | Disposition: A | Discharge status: Home / Self Care | Payer: 59 | Source: Ambulatory Visit | Attending: Rehabilitation | Admitting: Rehabilitation

## 2023-06-18 DIAGNOSIS — M47816 Spondylosis without myelopathy or radiculopathy, lumbar region: Secondary | ICD-10-CM
